# Patient Record
Sex: Male | Born: 1960 | Race: White | Hispanic: No | Marital: Married
Health system: Southern US, Community
[De-identification: ages and names within clinical notes are randomized; demographics above are authoritative.]

---

## 2014-06-13 ENCOUNTER — Emergency Department: Payer: Self-pay | Admitting: Emergency Medicine

## 2014-06-13 LAB — BASIC METABOLIC PANEL
Anion Gap: 8 (ref 7–16)
BUN: 16 mg/dL (ref 7–18)
CHLORIDE: 105 mmol/L (ref 98–107)
CO2: 27 mmol/L (ref 21–32)
Calcium, Total: 8.9 mg/dL (ref 8.5–10.1)
Creatinine: 0.99 mg/dL (ref 0.60–1.30)
EGFR (Non-African Amer.): >60
Glucose: 86 mg/dL (ref 65–99)
OSMOLALITY: 280 (ref 275–301)
Potassium: 4 mmol/L (ref 3.5–5.1)
Sodium: 140 mmol/L (ref 136–145)

## 2014-06-13 LAB — HEPATIC FUNCTION PANEL A (ARMC)
Albumin: 4 g/dL (ref 3.4–5.0)
Alkaline Phosphatase: 76 U/L
Bilirubin, Direct: <0.1 mg/dL (ref 0.00–0.20)
Bilirubin,Total: 0.5 mg/dL (ref 0.2–1.0)
SGOT(AST): 32 U/L (ref 15–37)
SGPT (ALT): 34 U/L (ref 12–78)
Total Protein: 7.3 g/dL (ref 6.4–8.2)

## 2014-06-13 LAB — ETHANOL: Ethanol: <3 mg/dL

## 2014-06-13 LAB — CBC
HCT: 44.7 % (ref 40.0–52.0)
HGB: 15 g/dL (ref 13.0–18.0)
MCH: 32.6 pg (ref 26.0–34.0)
MCHC: 33.6 g/dL (ref 32.0–36.0)
MCV: 97 fL (ref 80–100)
Platelet: 166 10*3/uL (ref 150–440)
RBC: 4.61 10*6/uL (ref 4.40–5.90)
RDW: 13.6 % (ref 11.5–14.5)
WBC: 7.4 10*3/uL (ref 3.8–10.6)

## 2014-06-13 LAB — TROPONIN I
Troponin-I: <0.02 ng/mL
Troponin-I: <0.02 ng/mL

## 2014-06-13 LAB — CK: CK, Total: 152 U/L

## 2014-06-13 LAB — LIPASE, BLOOD: Lipase: 241 U/L (ref 73–393)

## 2015-02-13 DIAGNOSIS — Z7185 Encounter for immunization safety counseling: Secondary | ICD-10-CM | POA: Insufficient documentation

## 2016-03-19 DIAGNOSIS — Z6836 Body mass index (BMI) 36.0-36.9, adult: Secondary | ICD-10-CM | POA: Insufficient documentation

## 2021-11-29 ENCOUNTER — Ambulatory Visit
Admission: RE | Admit: 2021-11-29 | Discharge: 2021-11-29 | Disposition: A | Discharge status: Home / Self Care | Payer: 59 | Attending: Internal Medicine | Admitting: Internal Medicine

## 2021-11-29 ENCOUNTER — Other Ambulatory Visit: Payer: Self-pay | Admitting: Internal Medicine

## 2021-11-29 ENCOUNTER — Ambulatory Visit
Admission: RE | Admit: 2021-11-29 | Discharge: 2021-11-29 | Disposition: A | Discharge status: Home / Self Care | Payer: 59 | Source: Ambulatory Visit | Attending: Internal Medicine | Admitting: Internal Medicine

## 2021-11-29 DIAGNOSIS — R0602 Shortness of breath: Secondary | ICD-10-CM

## 2021-11-29 DIAGNOSIS — R059 Cough, unspecified: Secondary | ICD-10-CM

## 2021-12-03 ENCOUNTER — Other Ambulatory Visit: Payer: Self-pay | Admitting: Internal Medicine

## 2021-12-03 DIAGNOSIS — R109 Unspecified abdominal pain: Secondary | ICD-10-CM

## 2021-12-19 ENCOUNTER — Ambulatory Visit
Admission: RE | Admit: 2021-12-19 | Discharge: 2021-12-19 | Disposition: A | Discharge status: Home / Self Care | Payer: 59 | Source: Ambulatory Visit | Attending: Internal Medicine | Admitting: Internal Medicine

## 2021-12-19 DIAGNOSIS — R109 Unspecified abdominal pain: Secondary | ICD-10-CM | POA: Diagnosis present

## 2022-02-06 ENCOUNTER — Other Ambulatory Visit: Payer: Self-pay | Admitting: Internal Medicine

## 2022-02-06 DIAGNOSIS — R221 Localized swelling, mass and lump, neck: Secondary | ICD-10-CM

## 2022-02-15 ENCOUNTER — Other Ambulatory Visit: Payer: Self-pay

## 2022-02-15 ENCOUNTER — Ambulatory Visit
Admission: RE | Admit: 2022-02-15 | Discharge: 2022-02-15 | Disposition: A | Discharge status: Home / Self Care | Payer: 59 | Source: Ambulatory Visit | Attending: Internal Medicine | Admitting: Internal Medicine

## 2022-02-15 DIAGNOSIS — R221 Localized swelling, mass and lump, neck: Secondary | ICD-10-CM | POA: Diagnosis present

## 2022-03-14 ENCOUNTER — Other Ambulatory Visit: Payer: Self-pay | Admitting: Unknown Physician Specialty

## 2022-03-14 DIAGNOSIS — K118 Other diseases of salivary glands: Secondary | ICD-10-CM

## 2022-03-19 NOTE — Progress Notes (Signed)
Spoke with patient 03/19/22 @ 11:45 am. Micah Flesher over moderate sedation with patient and informed patient that moderate sedation is offered if he would like. Patient declined moderate sedation for this procedure. Advised patient to arrive at 1:00 for 1:30 appointment since he will not be receiving sedation. Patient verbalized understanding. ?

## 2022-03-21 ENCOUNTER — Ambulatory Visit
Admission: RE | Admit: 2022-03-21 | Discharge: 2022-03-21 | Disposition: A | Discharge status: Home / Self Care | Payer: 59 | Source: Ambulatory Visit | Attending: Unknown Physician Specialty | Admitting: Unknown Physician Specialty

## 2022-03-21 DIAGNOSIS — K118 Other diseases of salivary glands: Secondary | ICD-10-CM

## 2022-03-21 NOTE — Procedures (Signed)
?  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 ? ?Postprocedure diagnosis: same ?EBL:    minimal ?Complications:   none immediate ? ?See full dictation in [facility]. ? ?D. Oley Balm MD ?Main # [phone] ?Pager  [phone] ?Mobile [phone] ?  ? ?

## 2022-03-22 LAB — SURGICAL PATHOLOGY

## 2022-08-22 ENCOUNTER — Ambulatory Visit: Payer: 59 | Admitting: Podiatry

## 2022-08-27 ENCOUNTER — Encounter: Payer: Self-pay | Admitting: Podiatry

## 2022-08-27 ENCOUNTER — Ambulatory Visit: Payer: 59 | Admitting: Podiatry

## 2022-08-27 DIAGNOSIS — L600 Ingrowing nail: Secondary | ICD-10-CM | POA: Diagnosis not present

## 2022-08-27 NOTE — Progress Notes (Signed)
  Subjective:  Patient ID: Mitchell Salas, male    DOB: [birth year],  MRN: [id number]  Chief Complaint  Patient presents with   Nail Problem    61 y.o. male presents with the above complaint.  Patient presents with right medial border ingrown.  Painful to touch is progressive gotten worse worse with ambulation worse with pressure.  Pain scale 7 out of 10.  Patient states that he try to do it himself but has gotten worse.  He wanted to get evaluated he has not seen anyone as prior to seeing me.  Denies any other acute complaints hurts with tight shoes.   Review of Systems: Negative except as noted in the HPI. Denies N/V/F/Ch.  History reviewed. No pertinent past medical history.  Current Outpatient Medications:    ALPRAZolam (XANAX) 0.5 MG tablet, Take 0.5 mg by mouth daily as needed., Disp: , Rfl:    busPIRone (BUSPAR) 10 MG tablet, Take 10 mg by mouth 2 (two) times daily., Disp: , Rfl:    citalopram (CELEXA) 20 MG tablet, Take 20 mg by mouth daily., Disp: , Rfl:    hydrOXYzine (ATARAX) 25 MG tablet, Take 25 mg by mouth at bedtime., Disp: , Rfl:    Magnesium (RA NATURAL MAGNESIUM) 250 MG TABS, Take 1 tablet by mouth daily., Disp: , Rfl:    Multiple Vitamin (MULTI-VITAMIN) tablet, Take 1 tablet by mouth daily., Disp: , Rfl:   Social History   Tobacco Use  Smoking Status Not on file  Smokeless Tobacco Not on file    Allergies  Allergen Reactions   Acetaminophen Nausea Only   Sulfa Antibiotics Hives   Objective:  There were no vitals filed for this visit. There is no height or weight on file to calculate BMI. Constitutional Well developed. Well nourished.  Vascular Dorsalis pedis pulses palpable bilaterally. Posterior tibial pulses palpable bilaterally. Capillary refill normal to all digits.  No cyanosis or clubbing noted. Pedal hair growth normal.  Neurologic Normal speech. Oriented to person, place, and time. Epicritic sensation to light touch grossly present bilaterally.   Dermatologic Painful ingrowing nail at medial nail borders of the hallux nail right. No other open wounds. No skin lesions.  Orthopedic: Normal joint ROM without pain or crepitus bilaterally. No visible deformities. No bony tenderness.   Radiographs: None Assessment:   1. Ingrown toenail of right foot    Plan:  Patient was evaluated and treated and all questions answered.  Ingrown Nail, right -Patient elects to proceed with minor surgery to remove ingrown toenail removal today. Consent reviewed and signed by patient. -Ingrown nail excised. See procedure note. -Educated on post-procedure care including soaking. Written instructions provided and reviewed. -Patient to follow up in 2 weeks for nail check.  Procedure: Excision of Ingrown Toenail Location: Right 1st toe medial nail borders. Anesthesia: Lidocaine 1% plain; 1.5 mL and Marcaine 0.5% plain; 1.5 mL, digital block. Skin Prep: Betadine. Dressing: Silvadene; telfa; dry, sterile, compression dressing. Technique: Following skin prep, the toe was exsanguinated and a tourniquet was secured at the base of the toe. The affected nail border was freed, split with a nail splitter, and excised. Chemical matrixectomy was then performed with phenol and irrigated out with alcohol. The tourniquet was then removed and sterile dressing applied. Disposition: Patient tolerated procedure well. Patient to return in 2 weeks for follow-up.   No follow-ups on file.

## 2023-04-25 IMAGING — US US ABDOMEN COMPLETE
1 series · 15 of 25 positions shown · non-contrast
Comparison: None.

CLINICAL DATA: Abdominal pain.

EXAM:
ABDOMEN ULTRASOUND COMPLETE

[Series 1: us abdomen complete · 15 of 88 slices shown]
[im 1/88]
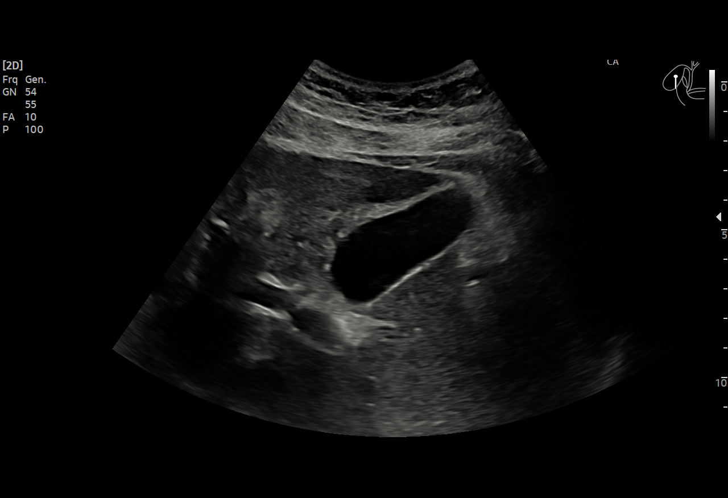
[im 8/88]
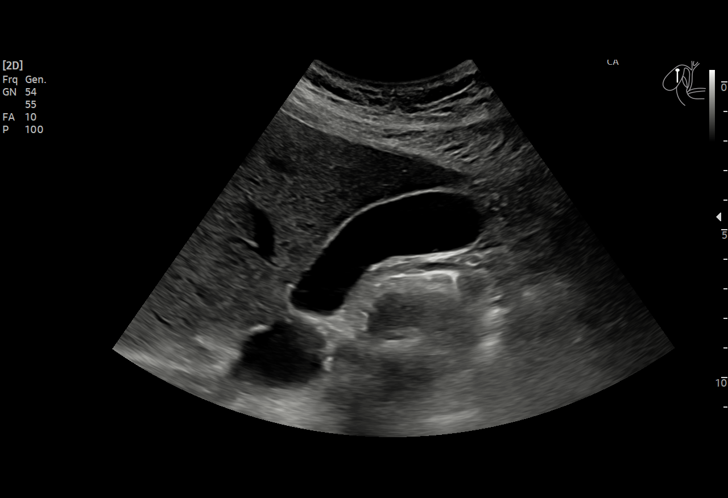
[im 15/88]
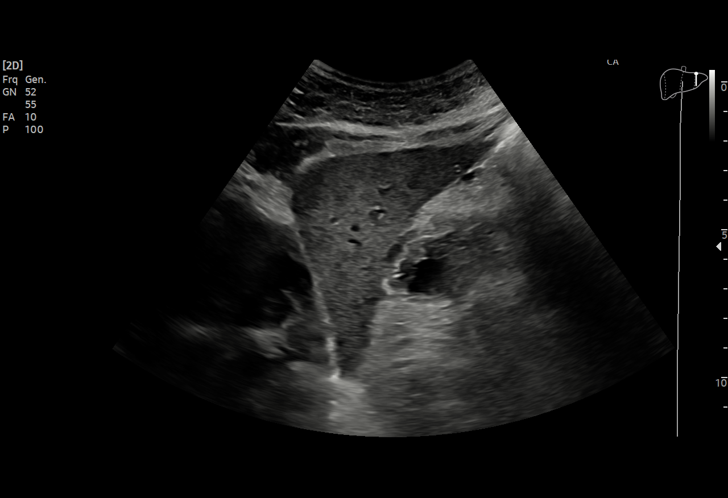
[im 19/88]
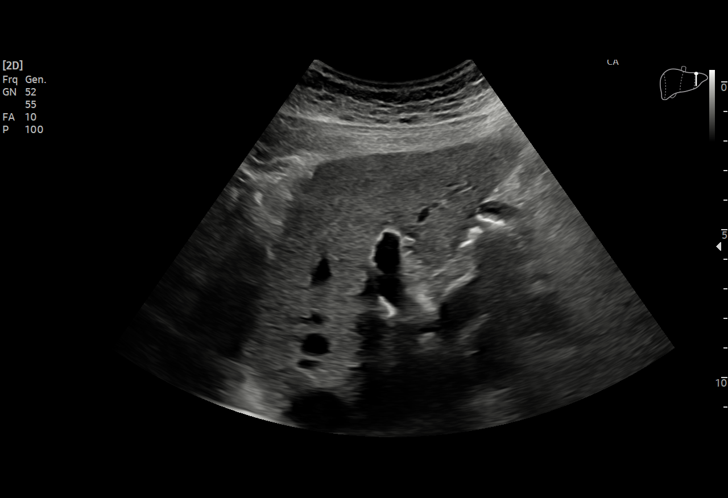
[im 26/88]
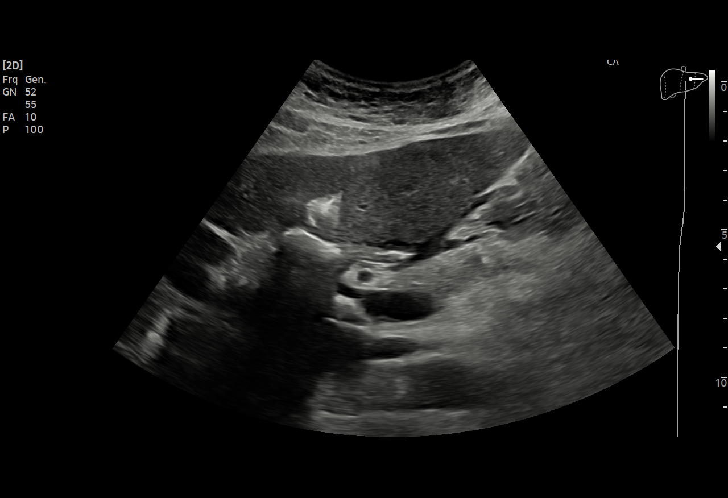
[im 33/88]
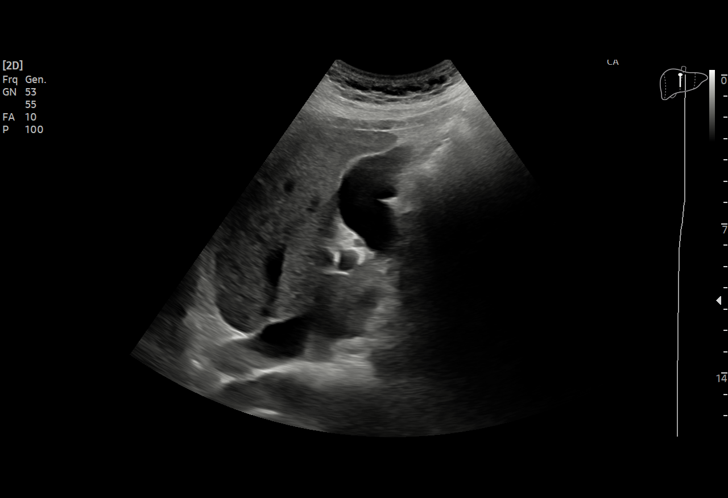
[im 37/88]
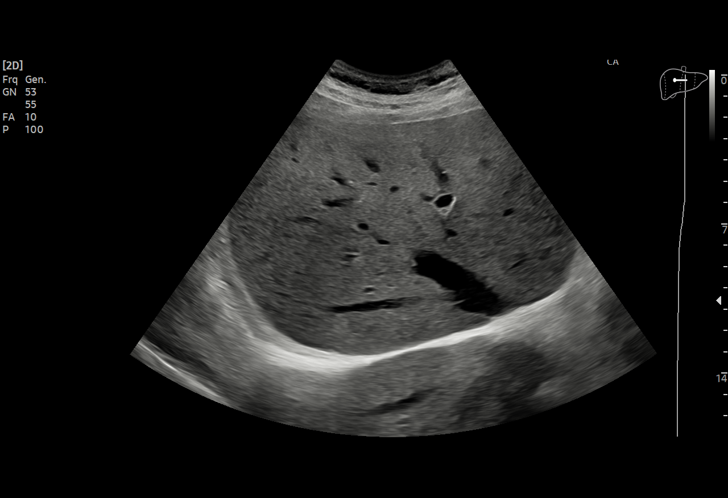
[im 44/88]
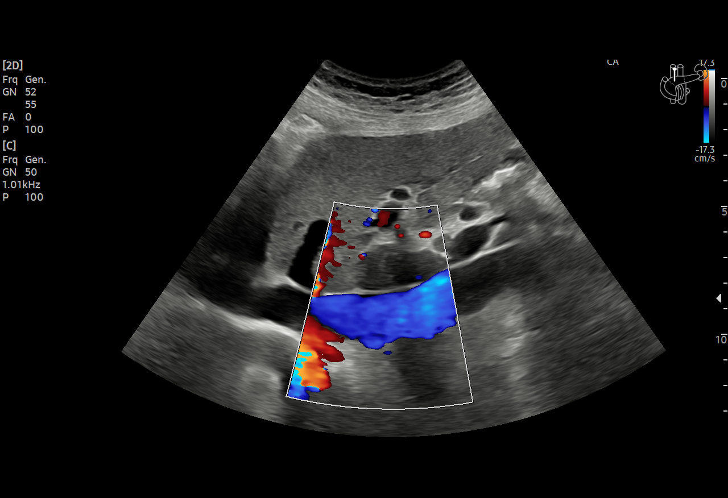
[im 51/88]
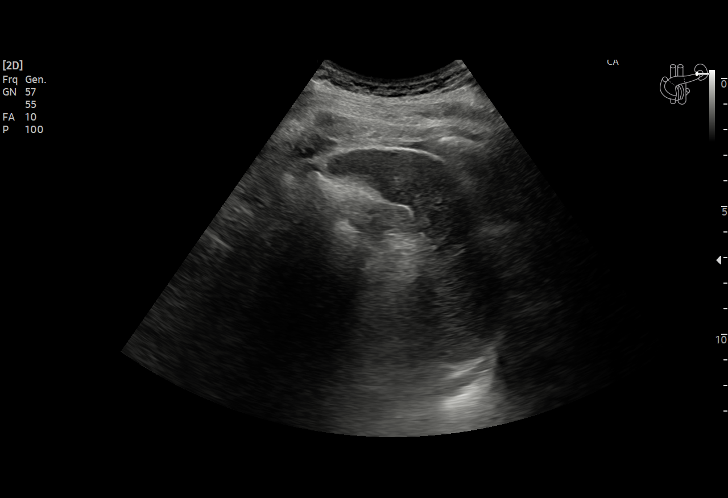
[im 55/88]
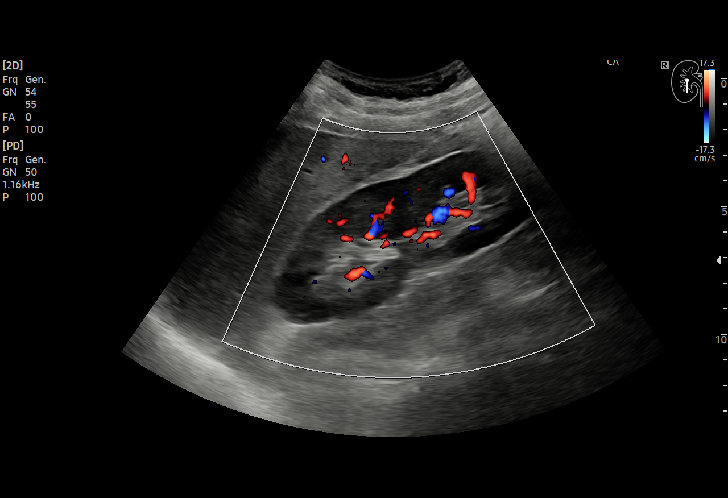
[im 62/88]
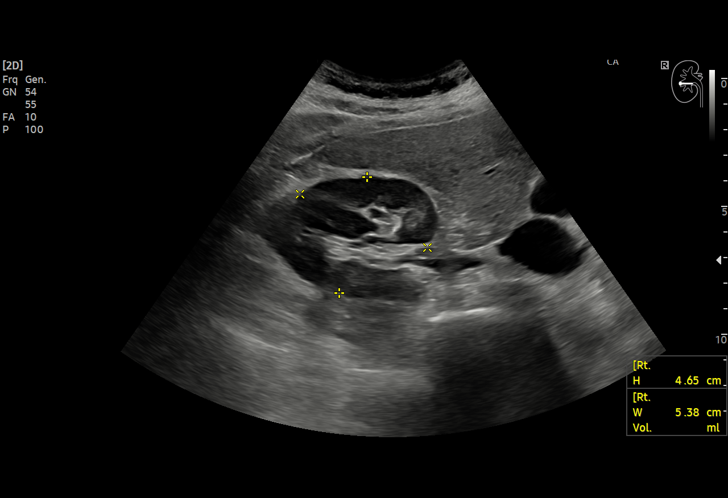
[im 69/88]
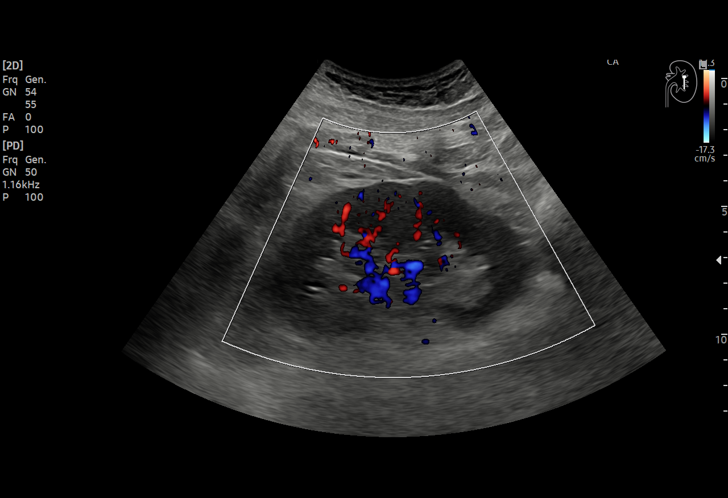
[im 73/88]
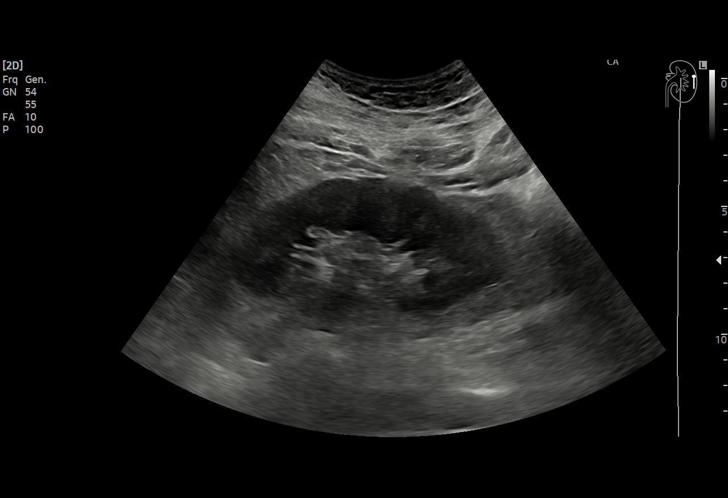
[im 80/88]
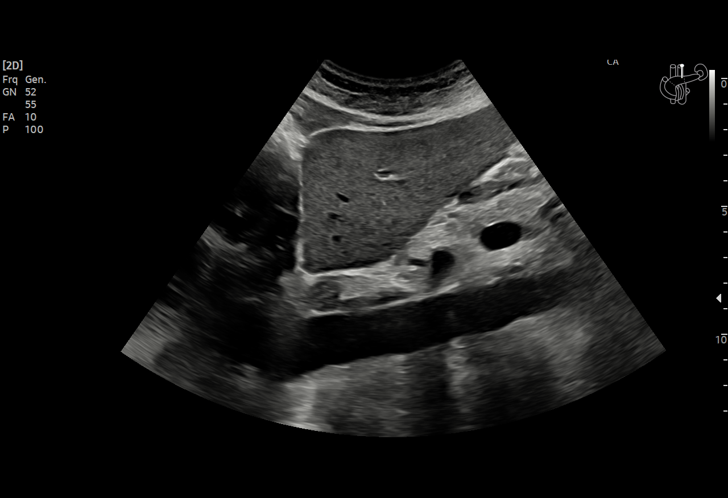
[im 88/88]
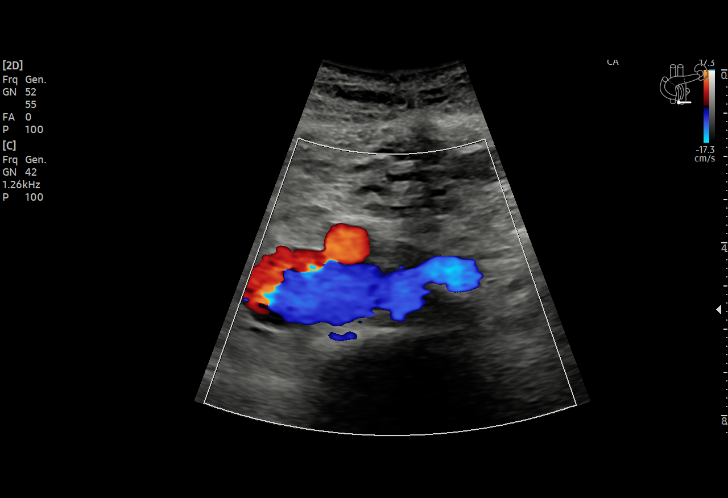

[15 of 25 positions shown; findings below may reference images not displayed]

FINDINGS: Gallbladder: No gallstones or wall thickening visualized (1.4 mm).
No sonographic Murphy sign noted by sonographer.

Common bile duct: Diameter: 2.9 mm

Liver: No focal lesion identified. Within normal limits in
parenchymal echogenicity. Portal vein is patent on color Doppler
imaging with normal direction of blood flow towards the liver.

IVC: No abnormality visualized.

Pancreas: Visualized portion unremarkable.

Spleen: Size (6.2 cm) and appearance within normal limits.

Right Kidney: Length: 11.1 cm. Echogenicity within normal limits. No
mass or hydronephrosis visualized.

Left Kidney: Length: 10.6 cm. Echogenicity within normal limits. No
mass or hydronephrosis visualized.

Abdominal aorta: No aneurysm visualized (2.1 cm in AP diameter).

Other findings: None.
IMPRESSION: Normal abdominal ultrasound.

## 2023-06-22 IMAGING — US US SOFT TISSUE HEAD/NECK
1 series · 12 of 12 positions shown · non-contrast
Comparison: Chest XR, 11/29/2021.

CLINICAL DATA: lump on right side of neck

EXAM:
ULTRASOUND OF HEAD/NECK SOFT TISSUES
TECHNIQUE: Ultrasound examination of the head and neck soft tissues was
performed in the area of clinical concern.

[Series 1: us soft tissue head/neck · 0.07mm/px · 12 acquisitions, 12 frames shown]
[im 1/12]
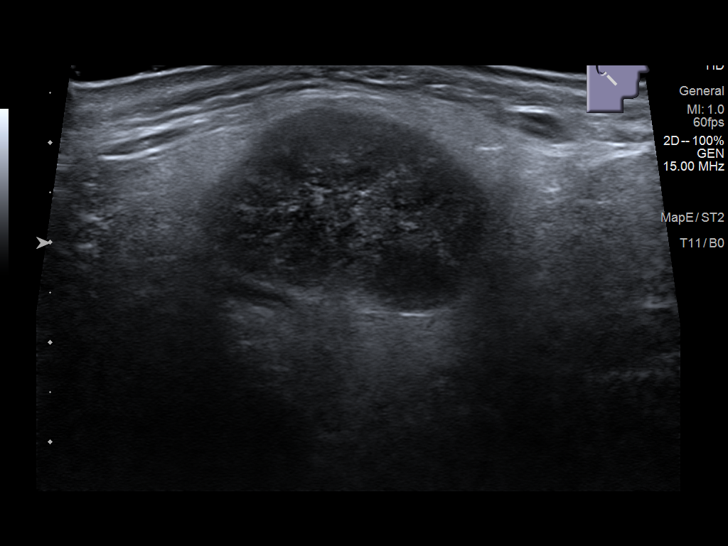
[im 2/12]
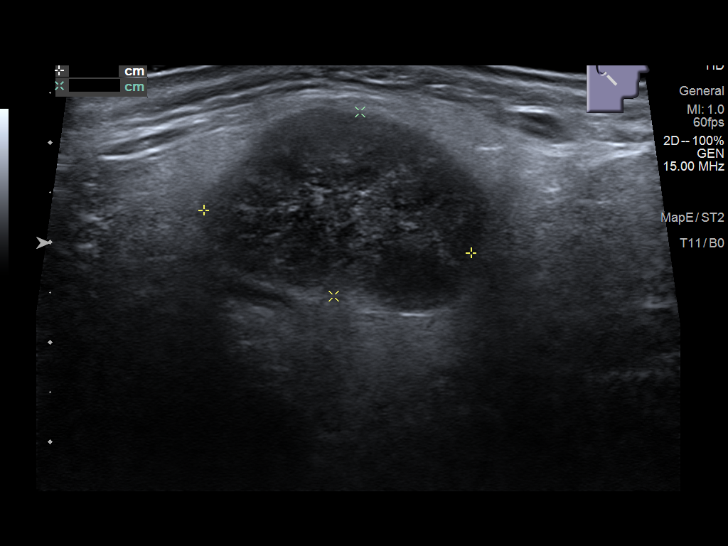
[im 3/12]
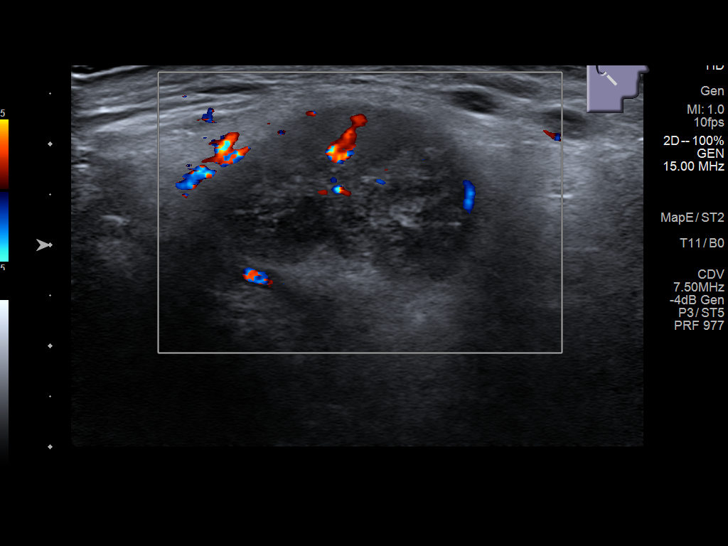
[im 4/12]
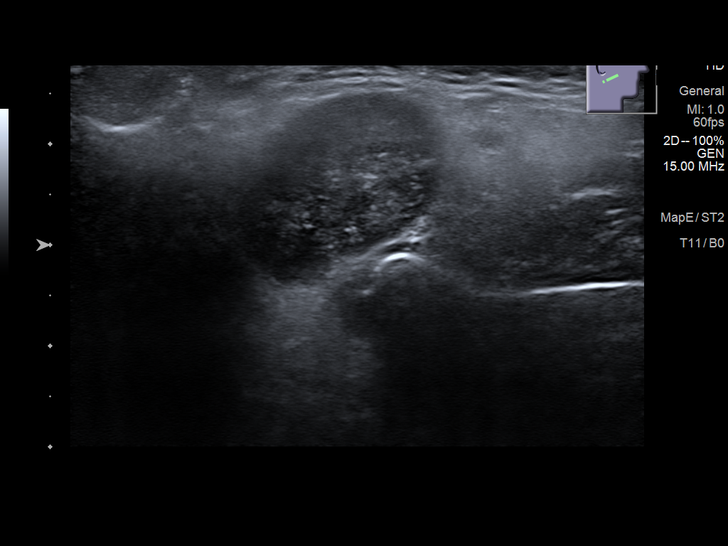
[im 5/12]
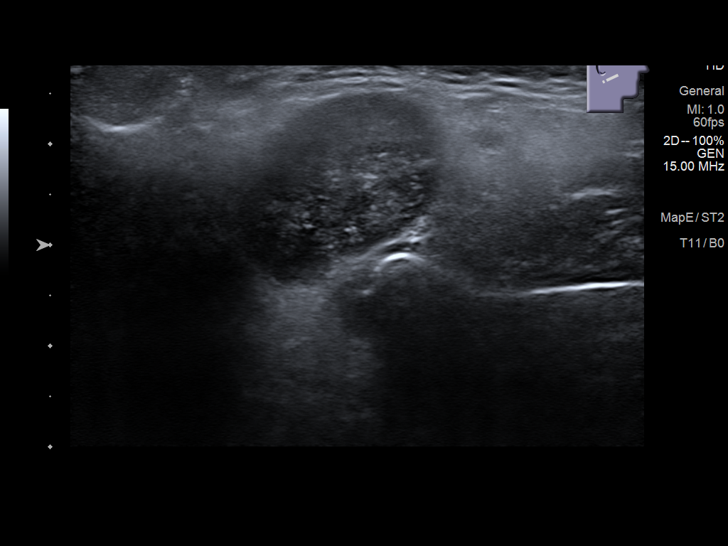
[im 6/12]
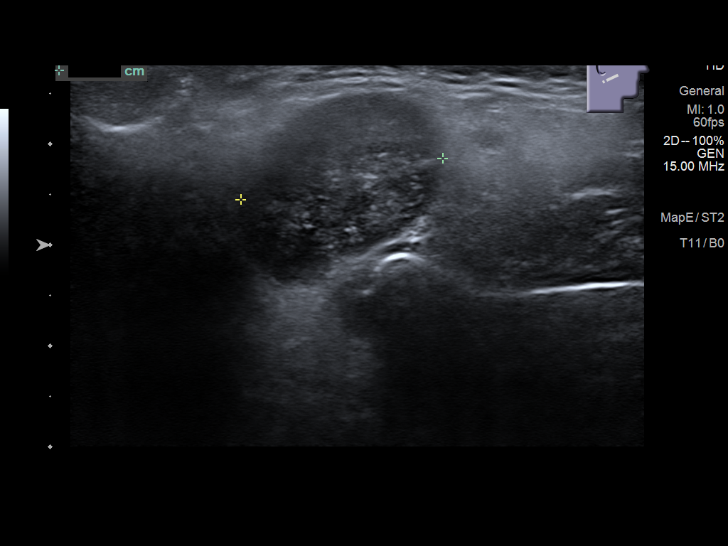
[im 7/12]
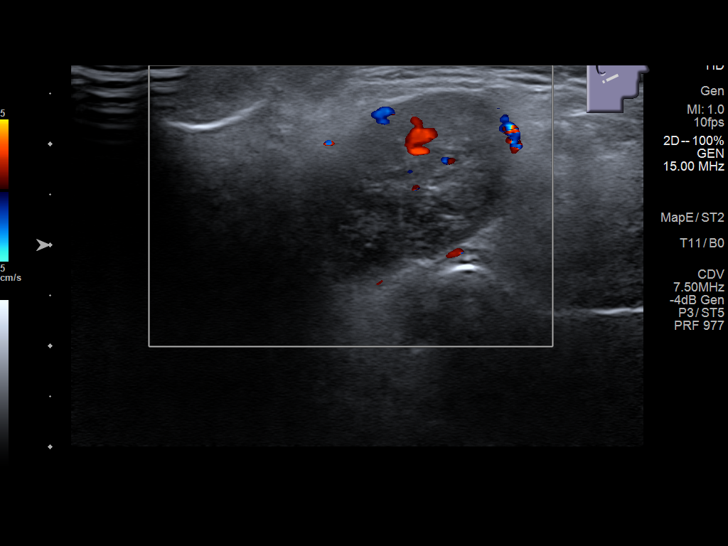
[im 8/12]
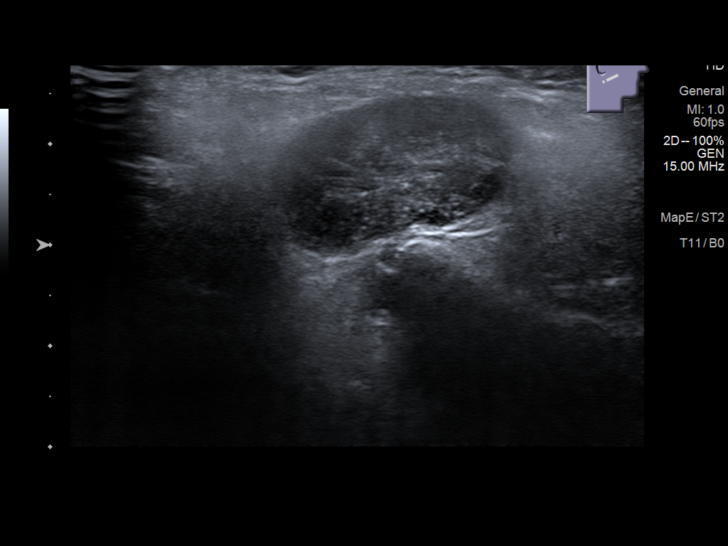
[im 9/12]
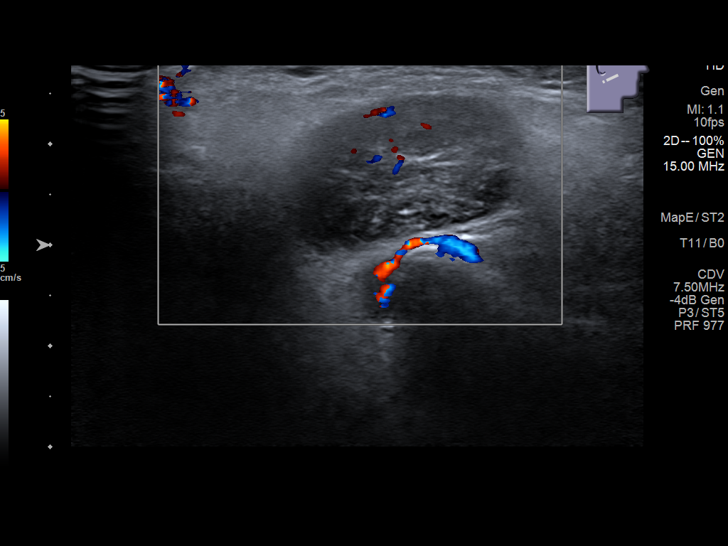
[im 10/12]
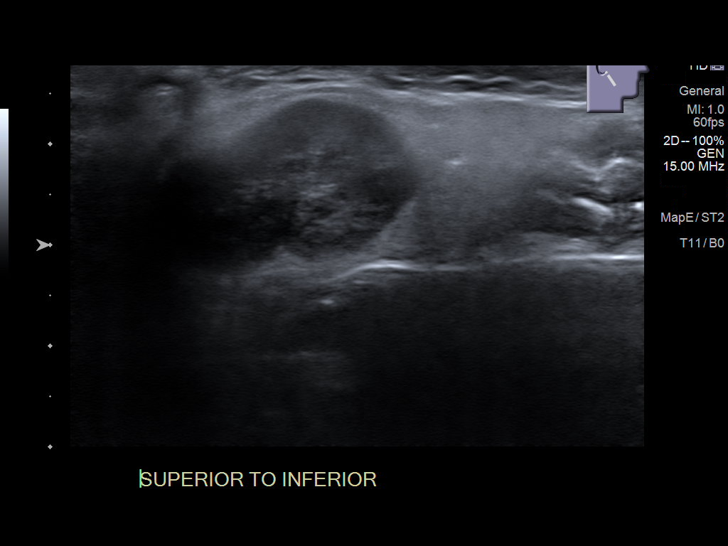
[im 11/12]
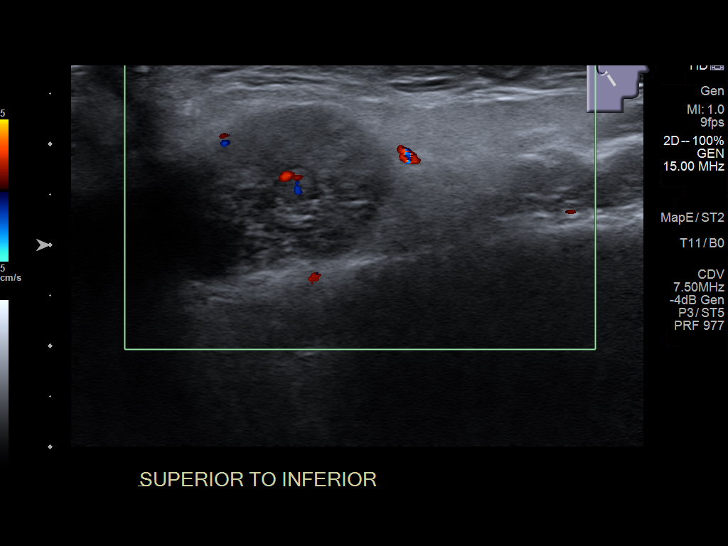
[im 12/12]
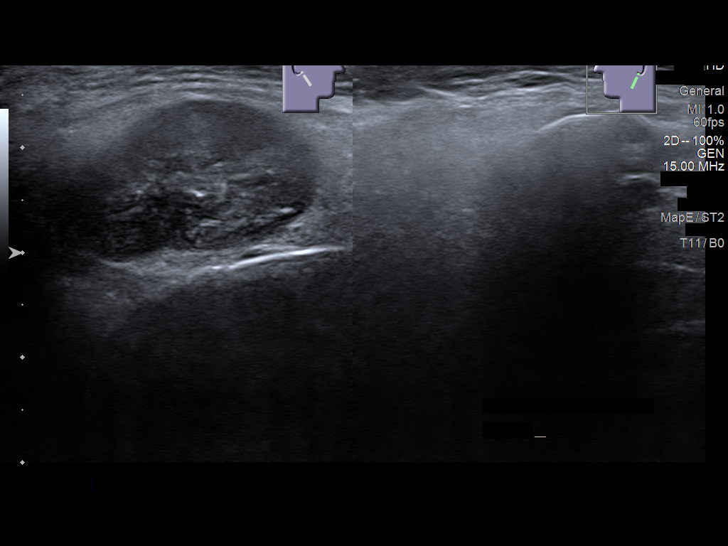

[12 of 12 positions shown; findings below may reference images not displayed]

FINDINGS: Focused ultrasound at the area of palpable concern, along the RIGHT
neck.

Images demonstrating an ovoid, solid, well-circumscribed vascular
mass with heterogeneous echogenic appearance.

The mass measures proximally 2.7 x 1.9 x 2.0 cm and appears to be
located subfascial at the RIGHT face/neck within the RIGHT parotid.

No cervical adenopathy or abnormal fluid collection within the
imaged neck.
IMPRESSION: 2.7 cm well-circumscribed heterogeneous mass within the RIGHT
parotid gland.

Differential diagnosis primarily includes Warthin's tumor. Consider
ENT referral and tissue sampling, if not already performed.
